# Patient Record
Sex: Female | Born: 1965 | Race: White | Hispanic: No | Marital: Single | Smoking: Never smoker
Health system: Southern US, Community
[De-identification: ages and names within clinical notes are randomized; demographics above are authoritative.]

## PROBLEM LIST (undated history)

## (undated) DIAGNOSIS — N6019 Diffuse cystic mastopathy of unspecified breast: Secondary | ICD-10-CM

## (undated) DIAGNOSIS — N63 Unspecified lump in unspecified breast: Secondary | ICD-10-CM

## (undated) DIAGNOSIS — E039 Hypothyroidism, unspecified: Secondary | ICD-10-CM

## (undated) DIAGNOSIS — T7840XA Allergy, unspecified, initial encounter: Secondary | ICD-10-CM

## (undated) DIAGNOSIS — C439 Malignant melanoma of skin, unspecified: Secondary | ICD-10-CM

## (undated) DIAGNOSIS — R319 Hematuria, unspecified: Secondary | ICD-10-CM

## (undated) HISTORY — DX: Hematuria, unspecified: R31.9

## (undated) HISTORY — DX: Unspecified lump in unspecified breast: N63.0

## (undated) HISTORY — DX: Malignant melanoma of skin, unspecified: C43.9

## (undated) HISTORY — DX: Hypothyroidism, unspecified: E03.9

## (undated) HISTORY — PX: TONSILLECTOMY: SHX5217

## (undated) HISTORY — DX: Allergy, unspecified, initial encounter: T78.40XA

## (undated) HISTORY — DX: Diffuse cystic mastopathy of unspecified breast: N60.19

---

## 1994-07-10 HISTORY — PX: CERVICAL CONE BIOPSY: SUR198

## 1997-12-10 ENCOUNTER — Encounter: Admission: RE | Admit: 1997-12-10 | Discharge: 1998-03-10 | Payer: Self-pay | Admitting: Internal Medicine

## 1999-06-22 ENCOUNTER — Other Ambulatory Visit: Admission: RE | Admit: 1999-06-22 | Discharge: 1999-06-22 | Payer: Self-pay | Admitting: Obstetrics and Gynecology

## 1999-12-13 ENCOUNTER — Other Ambulatory Visit: Admission: RE | Admit: 1999-12-13 | Discharge: 1999-12-13 | Payer: Self-pay | Admitting: Obstetrics and Gynecology

## 2000-12-18 ENCOUNTER — Other Ambulatory Visit: Admission: RE | Admit: 2000-12-18 | Discharge: 2000-12-18 | Payer: Self-pay | Admitting: Obstetrics and Gynecology

## 2001-03-25 ENCOUNTER — Other Ambulatory Visit: Admission: RE | Admit: 2001-03-25 | Discharge: 2001-03-25 | Payer: Self-pay | Admitting: Internal Medicine

## 2001-07-04 ENCOUNTER — Other Ambulatory Visit: Admission: RE | Admit: 2001-07-04 | Discharge: 2001-07-04 | Payer: Self-pay | Admitting: Obstetrics and Gynecology

## 2001-12-23 ENCOUNTER — Other Ambulatory Visit: Admission: RE | Admit: 2001-12-23 | Discharge: 2001-12-23 | Payer: Self-pay | Admitting: Obstetrics and Gynecology

## 2004-03-30 ENCOUNTER — Other Ambulatory Visit: Admission: RE | Admit: 2004-03-30 | Discharge: 2004-03-30 | Payer: Self-pay | Admitting: Obstetrics and Gynecology

## 2005-01-12 ENCOUNTER — Other Ambulatory Visit: Admission: RE | Admit: 2005-01-12 | Discharge: 2005-01-12 | Payer: Self-pay | Admitting: Obstetrics and Gynecology

## 2006-01-25 ENCOUNTER — Other Ambulatory Visit: Admission: RE | Admit: 2006-01-25 | Discharge: 2006-01-25 | Payer: Self-pay | Admitting: Obstetrics and Gynecology

## 2010-05-25 ENCOUNTER — Encounter: Admission: RE | Admit: 2010-05-25 | Discharge: 2010-05-25 | Payer: Self-pay | Admitting: Obstetrics and Gynecology

## 2011-06-14 ENCOUNTER — Other Ambulatory Visit: Payer: Self-pay | Admitting: Obstetrics and Gynecology

## 2011-06-14 DIAGNOSIS — Z1231 Encounter for screening mammogram for malignant neoplasm of breast: Secondary | ICD-10-CM

## 2011-06-29 ENCOUNTER — Ambulatory Visit: Payer: Self-pay

## 2011-06-29 ENCOUNTER — Ambulatory Visit
Admission: RE | Admit: 2011-06-29 | Discharge: 2011-06-29 | Disposition: A | Discharge status: Home / Self Care | Payer: PRIVATE HEALTH INSURANCE | Source: Ambulatory Visit | Attending: Obstetrics and Gynecology | Admitting: Obstetrics and Gynecology

## 2011-06-29 DIAGNOSIS — Z1231 Encounter for screening mammogram for malignant neoplasm of breast: Secondary | ICD-10-CM

## 2011-07-29 IMAGING — MG MM DIGITAL SCREENING
4 series · 4 of 4 positions shown · non-contrast
Comparison: Prior studies.

DG SCREEN MAMMOGRAM BILATERAL
Bilateral CC and MLO view(s) were taken.

DIGITAL SCREENING MAMMOGRAM WITH CAD:

[R CC]
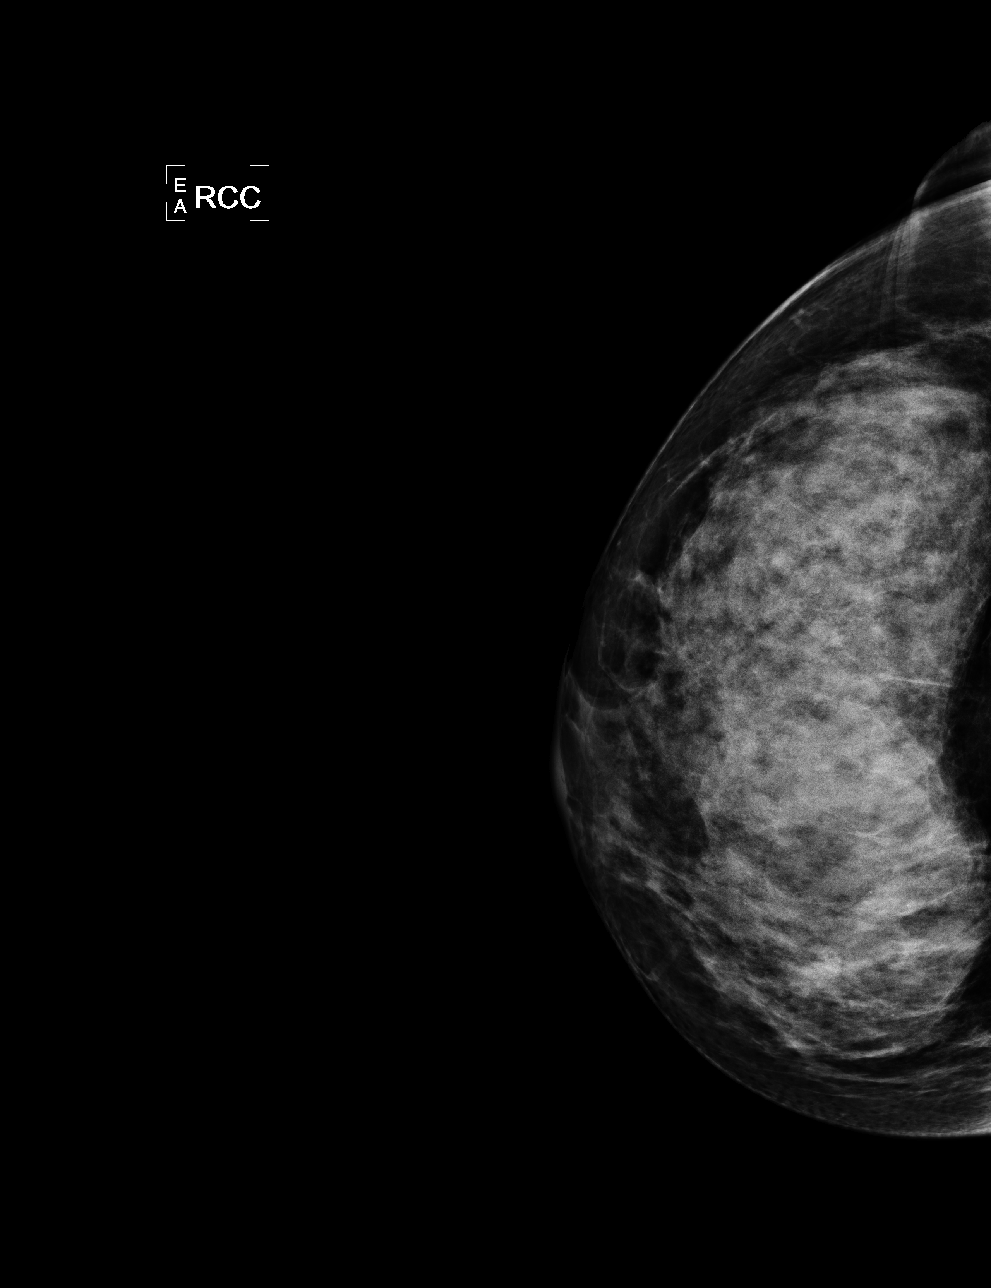

[L CC]
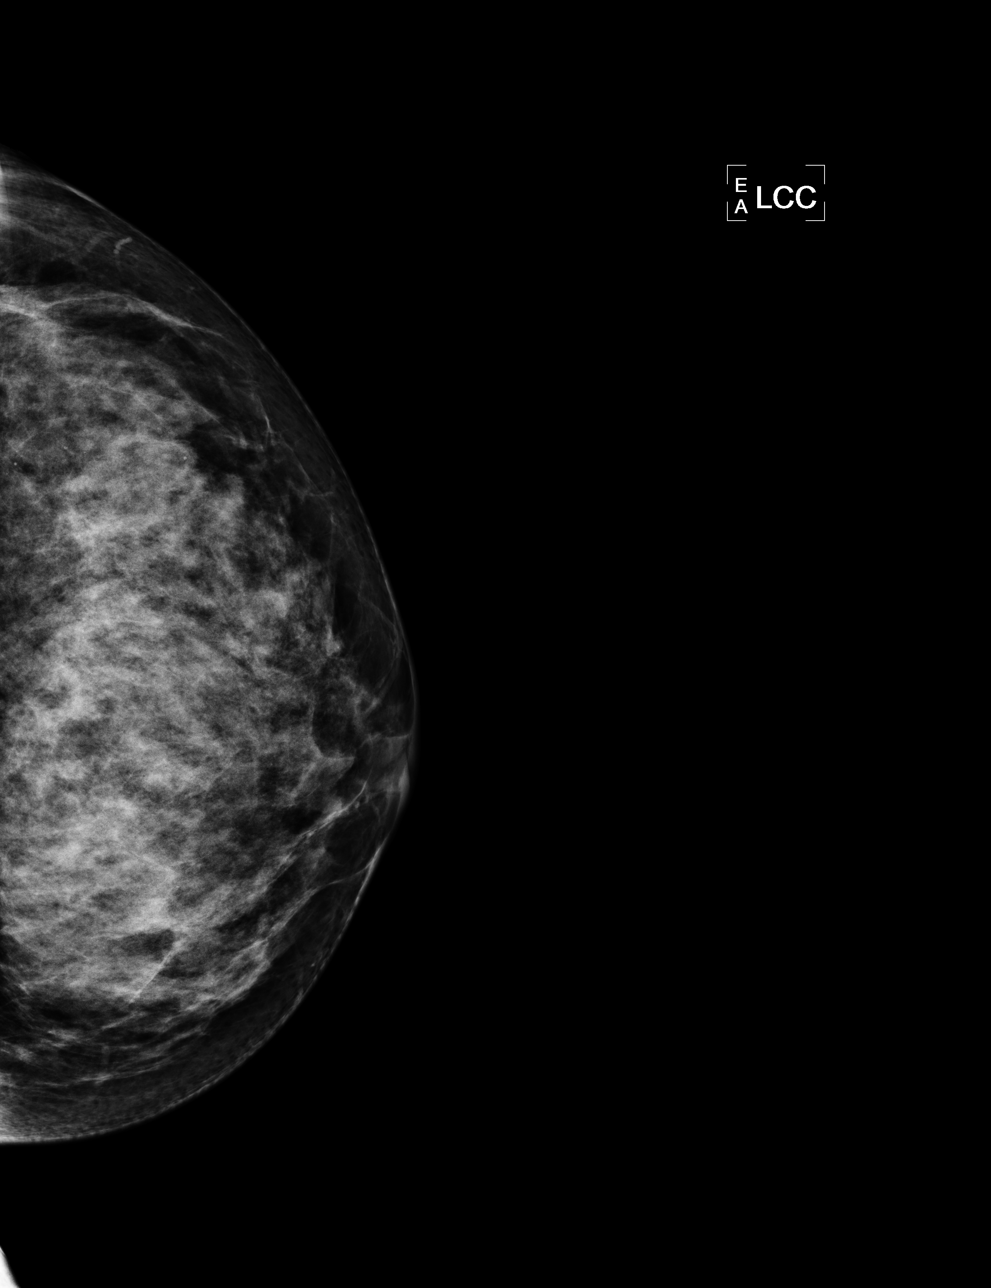

[L MLO]
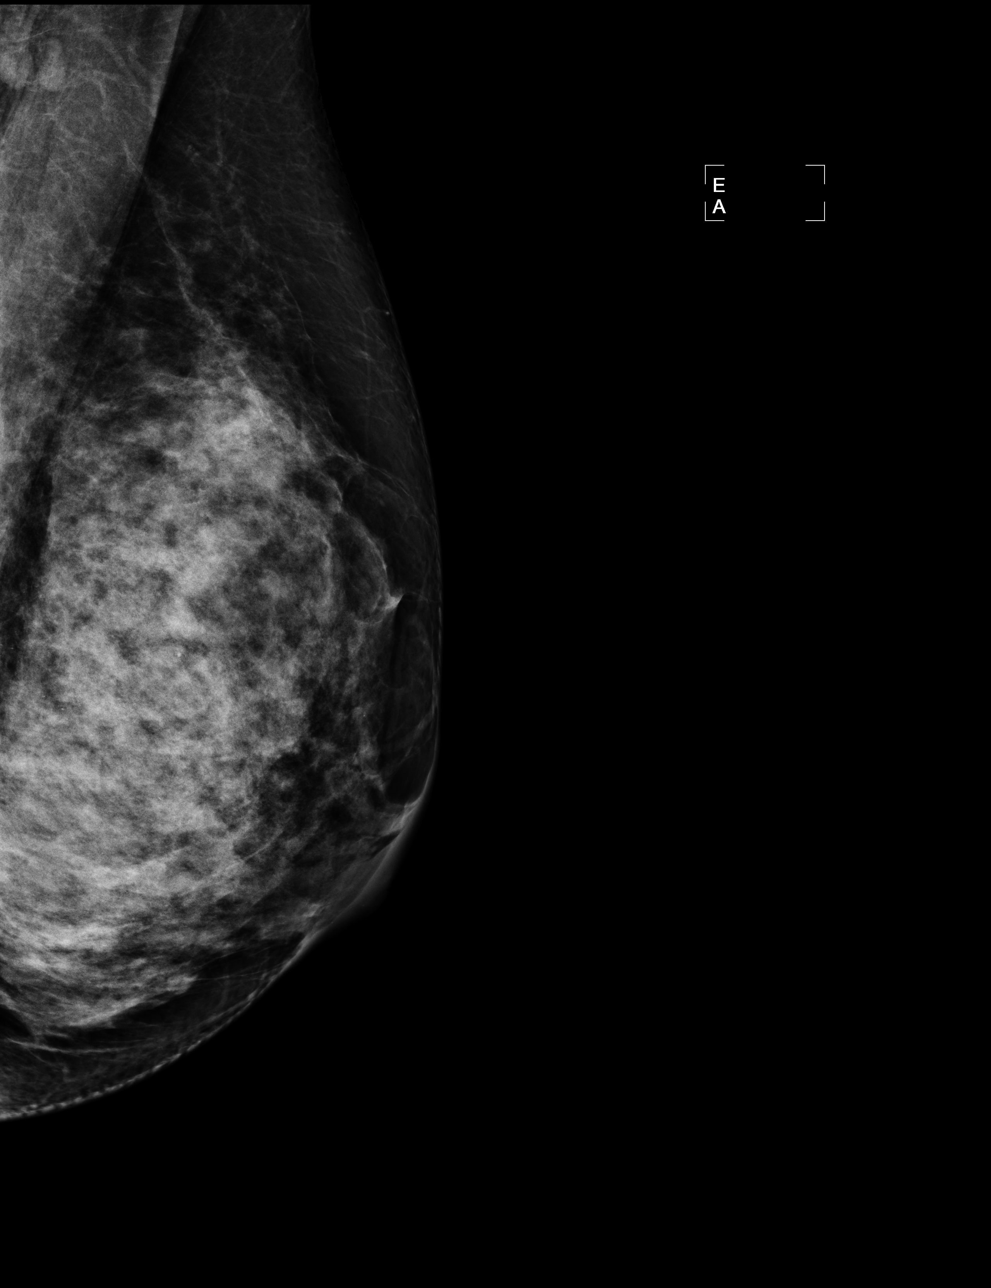

[R MLO]
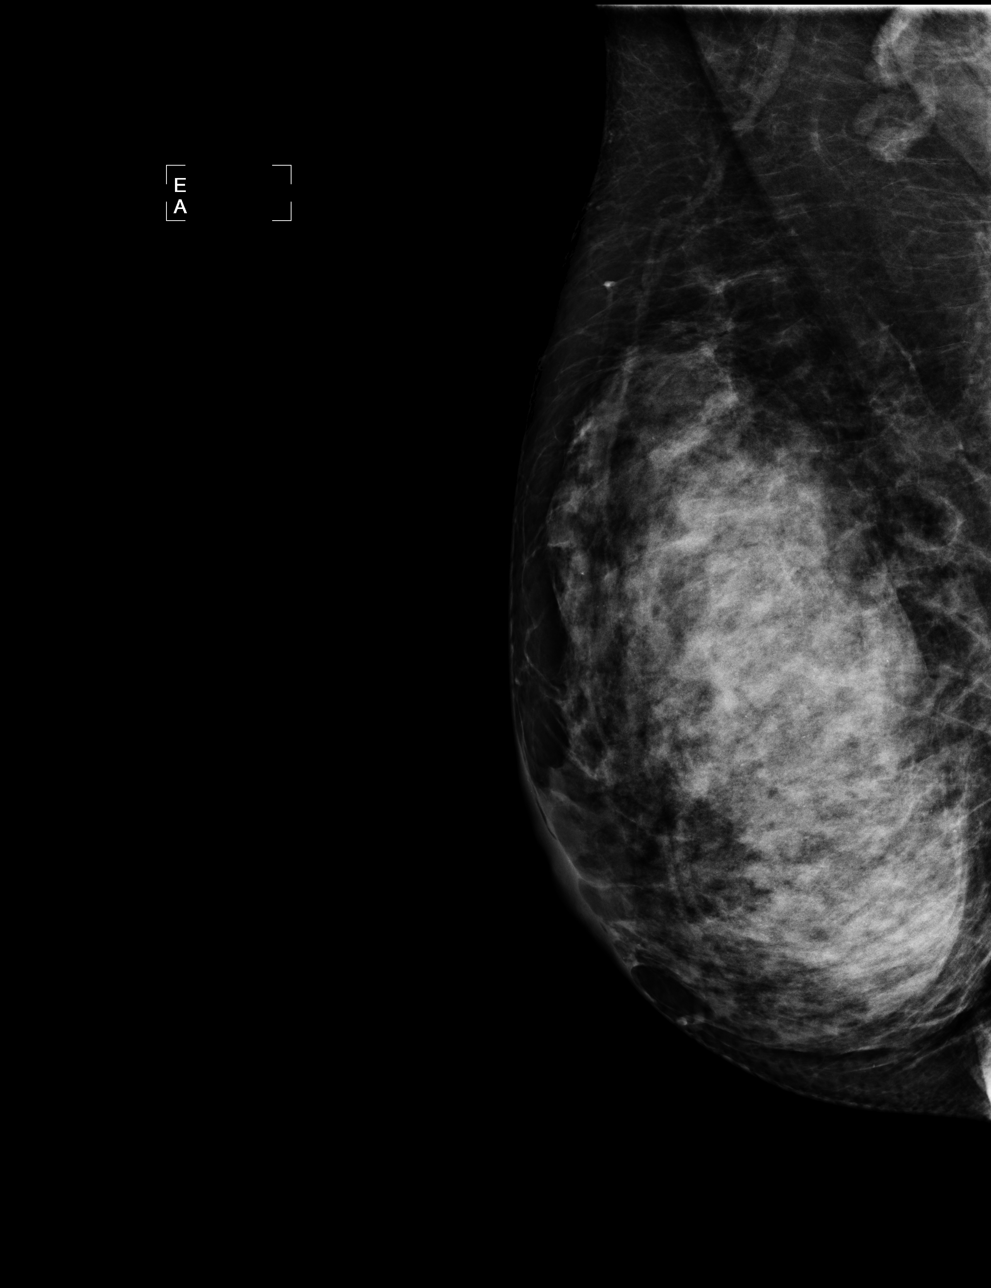

[4 of 4 positions shown; findings below may reference images not displayed]

The breast tissue is extremely dense.  There is no dominant mass, architectural distortion or 
calcification to suggest malignancy.

Images were processed with CAD.
IMPRESSION: No mammographic evidence of malignancy.  Suggest yearly screening mammography.

A result letter of this screening mammogram will be mailed directly to the patient.

ASSESSMENT: Negative - BI-RADS 1

Screening mammogram in 1 year.
,

## 2011-08-30 ENCOUNTER — Encounter: Payer: Self-pay | Admitting: Family Medicine

## 2011-08-30 ENCOUNTER — Ambulatory Visit (INDEPENDENT_AMBULATORY_CARE_PROVIDER_SITE_OTHER): Payer: PRIVATE HEALTH INSURANCE | Admitting: Family Medicine

## 2011-08-30 VITALS — BP 122/76 | HR 80 | Ht 65.0 in | Wt 126.0 lb

## 2011-08-30 DIAGNOSIS — M25519 Pain in unspecified shoulder: Secondary | ICD-10-CM

## 2011-08-30 MED ORDER — NAPROXEN SODIUM ER 500 MG PO TB24: ORAL_TABLET | ORAL | Status: DC
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
## 2011-08-30 NOTE — Patient Instructions (Signed)
Will start with anti-inflammatory course.  If symptoms persist or worsen, will then check x-ray.  If x-ray is normal, but pain persists, then consider labs (ESR, CBC) and referral to orthopedist.  Trial of cool compresses/ice (since heat exacerbated the burning sensation) an rest.

## 2011-09-11 ENCOUNTER — Ambulatory Visit: Payer: PRIVATE HEALTH INSURANCE | Admitting: Family Medicine

## 2011-09-15 ENCOUNTER — Telehealth: Payer: Self-pay | Admitting: Internal Medicine

## 2011-09-15 DIAGNOSIS — M25519 Pain in unspecified shoulder: Secondary | ICD-10-CM

## 2011-09-15 NOTE — Telephone Encounter (Signed)
Advise pt x-ray in system for her to go to [facility].  Okay for her to do labs elsewhere if more convenient for her.  I'd start with CBC and ESR (sed rate).  Will do referral to ortho when results are back.  The other option is just to send her directly to ortho, and they can do x-rays at their office.  If she chooses that, please refer to [place] [facility] (or elsewhere if she has a preference) and cancel x-ray order.

## 2011-09-15 NOTE — Telephone Encounter (Signed)
Pt informed and getting xray

## 2011-09-21 ENCOUNTER — Ambulatory Visit
Admission: RE | Admit: 2011-09-21 | Discharge: 2011-09-21 | Disposition: A | Discharge status: Home / Self Care | Payer: PRIVATE HEALTH INSURANCE | Source: Ambulatory Visit | Attending: Family Medicine | Admitting: Family Medicine

## 2011-09-21 DIAGNOSIS — M25519 Pain in unspecified shoulder: Secondary | ICD-10-CM

## 2011-09-27 ENCOUNTER — Telehealth: Payer: Self-pay | Admitting: *Deleted

## 2011-09-27 NOTE — Telephone Encounter (Signed)
Tried to call patient to give her consult info on Monday, not sure if she called back and spoke to someone so I called her again and left a message informing her that she is scheduled with Dr.Chandler @ [facility] 09/29/11 @ 9:45am.

## 2011-10-01 ENCOUNTER — Other Ambulatory Visit: Payer: Self-pay | Admitting: Family Medicine

## 2011-10-02 ENCOUNTER — Telehealth: Payer: Self-pay | Admitting: *Deleted

## 2011-10-02 NOTE — Telephone Encounter (Signed)
Called patient and left message to see if patient saw ortho as scheduled on 09/29/11. Trying to see if she still needs this rx to be filled.

## 2011-10-02 NOTE — Telephone Encounter (Signed)
Left message for patient to call me back to find out whether she saw orthopedics on 09/29/11. Not sure that she should need this medication if she was seen by them.

## 2011-10-03 NOTE — Telephone Encounter (Signed)
Pt called and she did go to the ortho last week and had MRI and she does want the Naproxyn refilled.

## 2011-10-03 NOTE — Telephone Encounter (Signed)
Is this ok?

## 2011-10-03 NOTE — Telephone Encounter (Signed)
E-rxd

## 2011-11-08 ENCOUNTER — Encounter: Payer: Self-pay | Admitting: Nurse Practitioner

## 2012-01-01 ENCOUNTER — Other Ambulatory Visit: Payer: Self-pay | Admitting: Obstetrics and Gynecology

## 2012-01-01 DIAGNOSIS — N6459 Other signs and symptoms in breast: Secondary | ICD-10-CM

## 2012-01-08 ENCOUNTER — Ambulatory Visit
Admission: RE | Admit: 2012-01-08 | Discharge: 2012-01-08 | Disposition: A | Discharge status: Home / Self Care | Payer: Self-pay | Source: Ambulatory Visit | Attending: Obstetrics and Gynecology | Admitting: Obstetrics and Gynecology

## 2012-01-08 DIAGNOSIS — N6459 Other signs and symptoms in breast: Secondary | ICD-10-CM

## 2012-07-04 ENCOUNTER — Other Ambulatory Visit: Payer: Self-pay | Admitting: Obstetrics and Gynecology

## 2012-07-04 DIAGNOSIS — Z1231 Encounter for screening mammogram for malignant neoplasm of breast: Secondary | ICD-10-CM

## 2012-07-19 ENCOUNTER — Ambulatory Visit
Admission: RE | Admit: 2012-07-19 | Discharge: 2012-07-19 | Disposition: A | Discharge status: Home / Self Care | Payer: Self-pay | Source: Ambulatory Visit | Attending: Obstetrics and Gynecology | Admitting: Obstetrics and Gynecology

## 2012-07-19 DIAGNOSIS — Z1231 Encounter for screening mammogram for malignant neoplasm of breast: Secondary | ICD-10-CM

## 2012-07-25 ENCOUNTER — Ambulatory Visit: Payer: Self-pay

## 2012-07-31 ENCOUNTER — Other Ambulatory Visit: Payer: Self-pay | Admitting: Obstetrics and Gynecology

## 2012-07-31 DIAGNOSIS — R928 Other abnormal and inconclusive findings on diagnostic imaging of breast: Secondary | ICD-10-CM

## 2012-08-06 ENCOUNTER — Ambulatory Visit
Admission: RE | Admit: 2012-08-06 | Discharge: 2012-08-06 | Disposition: A | Discharge status: Home / Self Care | Payer: BC Managed Care – PPO | Source: Ambulatory Visit | Attending: Obstetrics and Gynecology | Admitting: Obstetrics and Gynecology

## 2012-08-06 DIAGNOSIS — R928 Other abnormal and inconclusive findings on diagnostic imaging of breast: Secondary | ICD-10-CM

## 2012-08-19 ENCOUNTER — Other Ambulatory Visit: Payer: Self-pay | Admitting: Family Medicine

## 2012-11-23 IMAGING — CR DG CLAVICLE*R*
2 series · 2 of 2 positions shown · non-contrast
Comparison: None.

CLINICAL DATA: Sternoclavicular swelling

RIGHT CLAVICLE - 2+ VIEWS

[view not recorded (1 of 2)]
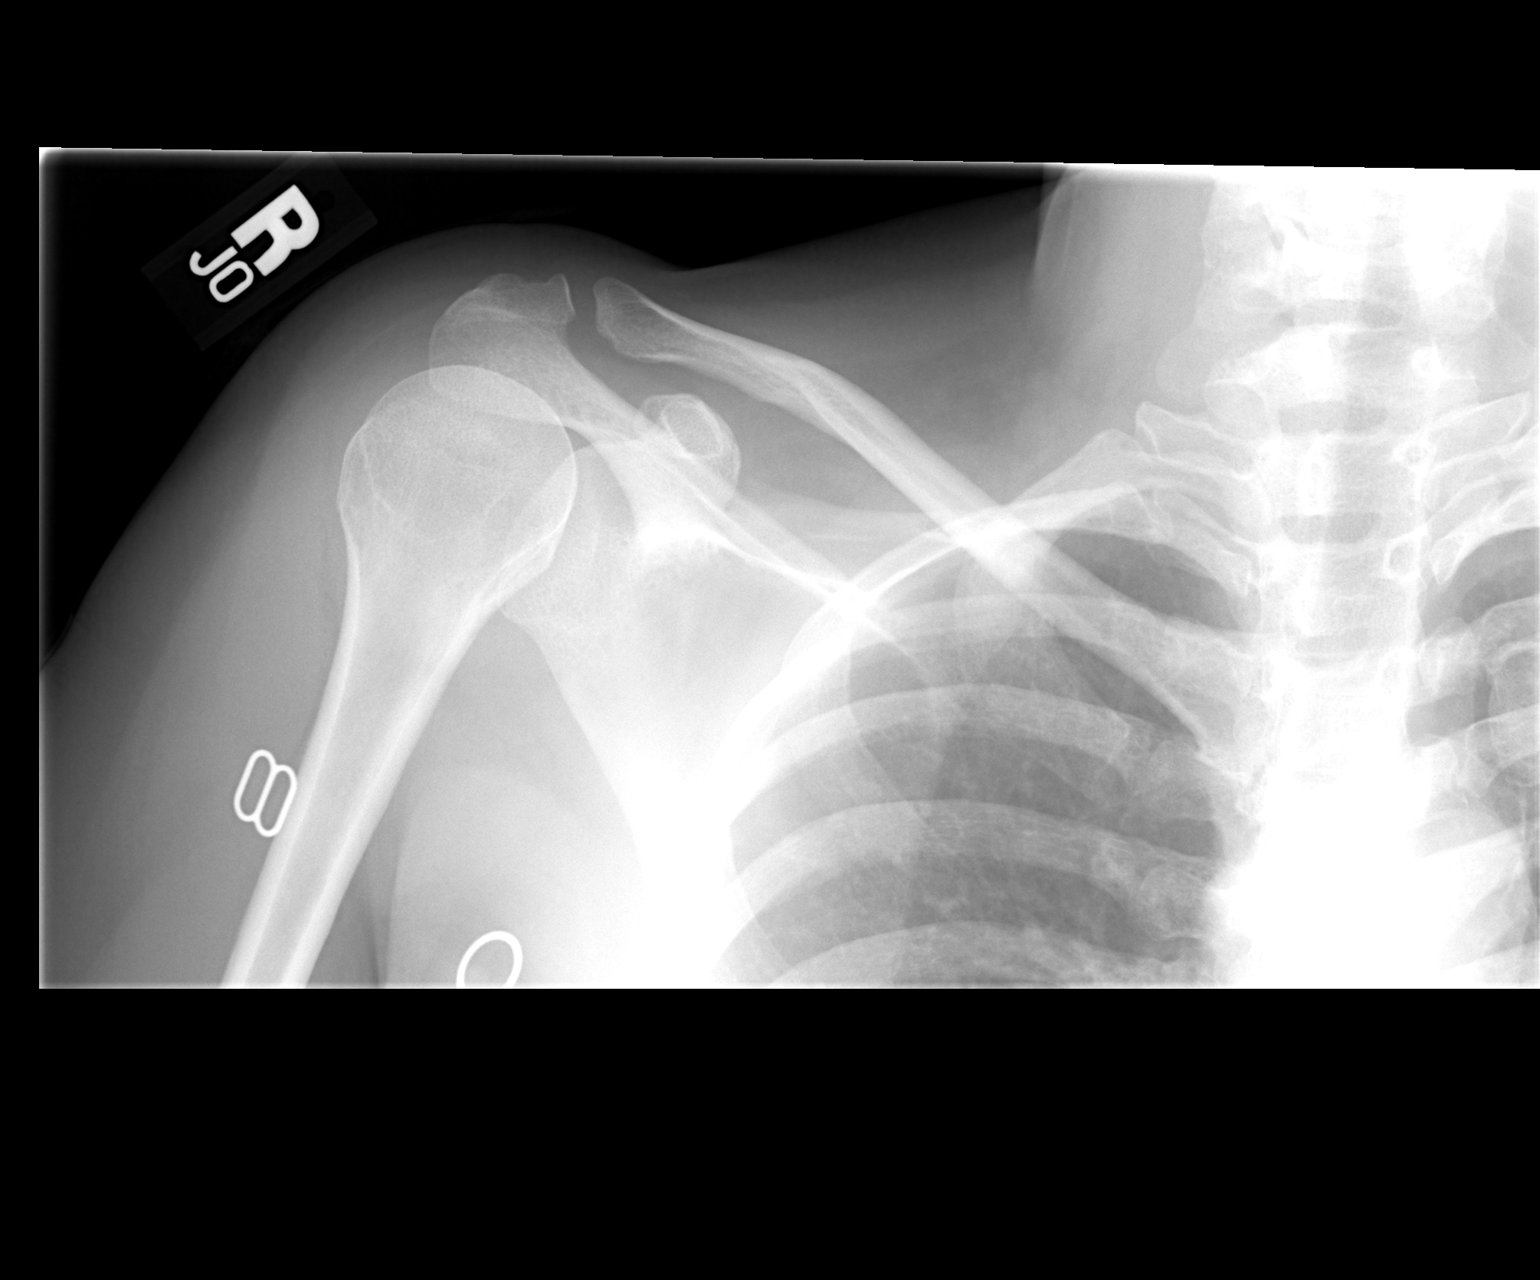

[view not recorded (2 of 2)]
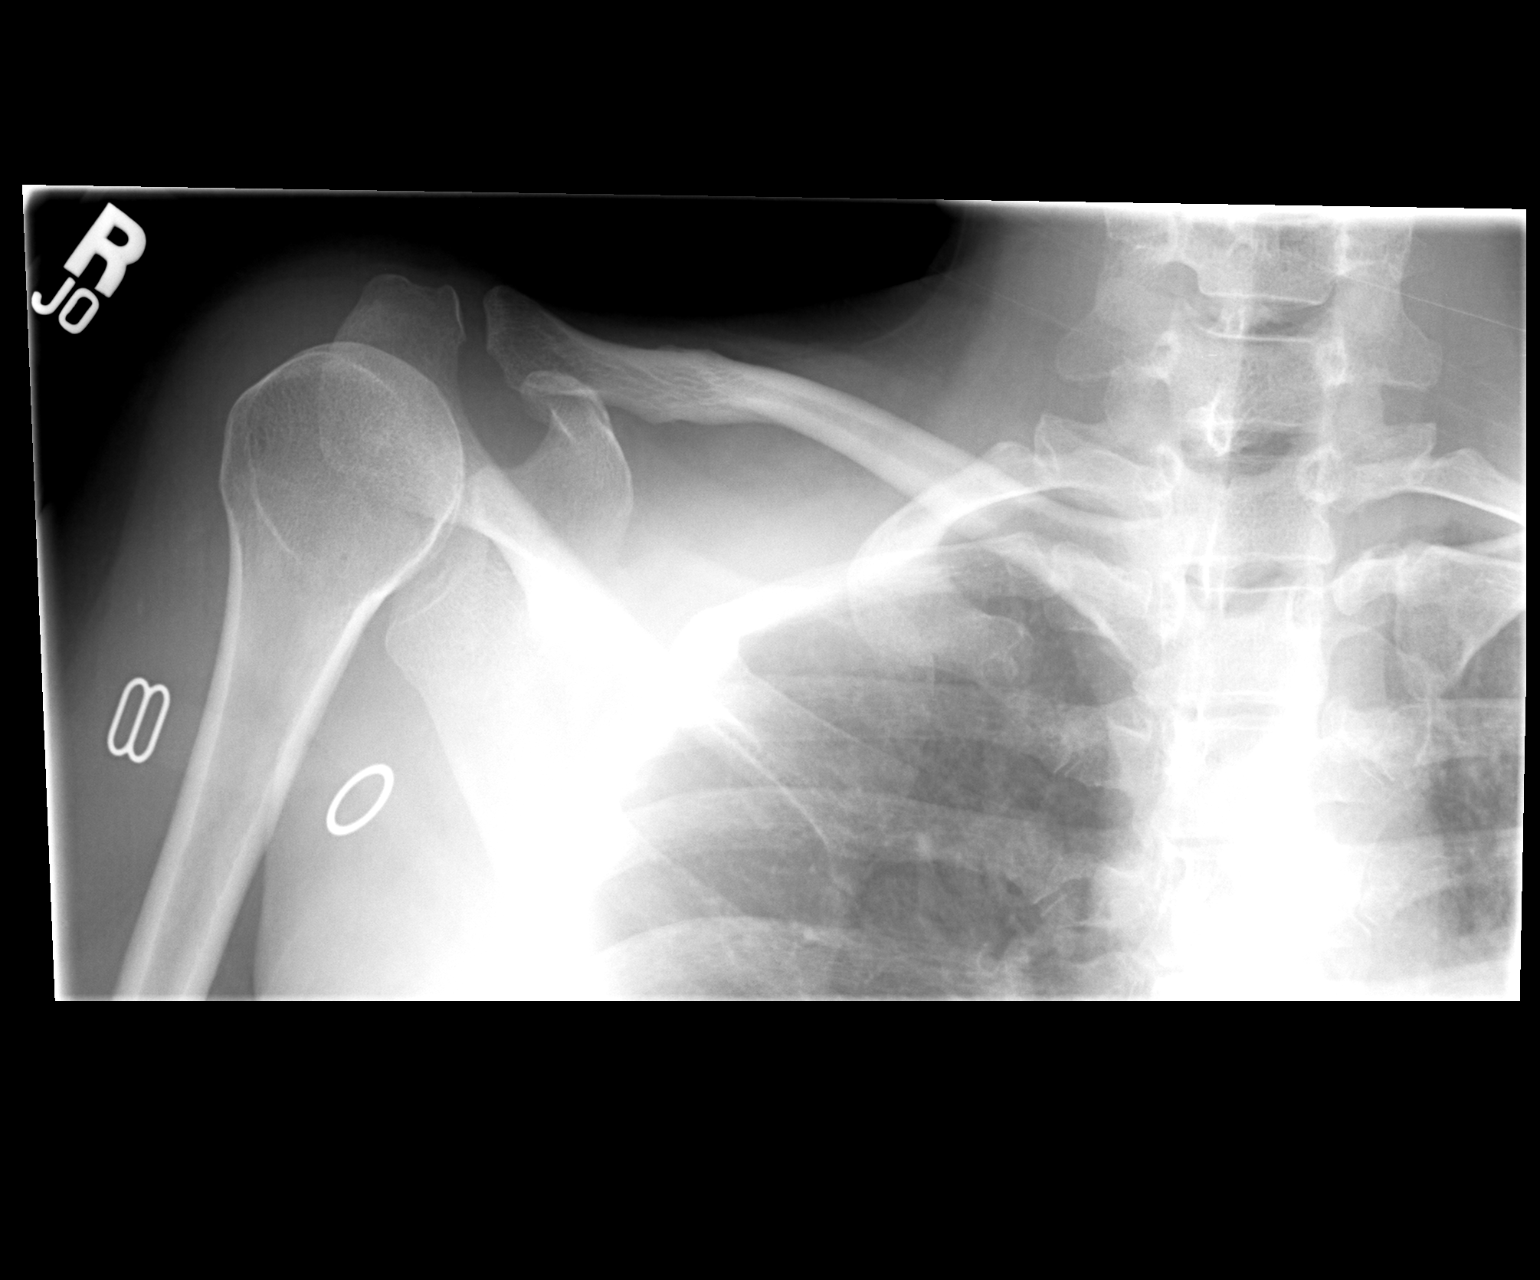

[2 of 2 positions shown; findings below may reference images not displayed]

FINDINGS: Negative for fracture, dislocation, or other acute
abnormality.  Normal alignment and mineralization. No significant
degenerative change.  Regional soft tissues unremarkable.
IMPRESSION: Negative

## 2013-01-07 DIAGNOSIS — C439 Malignant melanoma of skin, unspecified: Secondary | ICD-10-CM

## 2013-01-07 HISTORY — DX: Malignant melanoma of skin, unspecified: C43.9

## 2013-02-25 ENCOUNTER — Ambulatory Visit: Payer: BC Managed Care – PPO

## 2013-03-31 ENCOUNTER — Ambulatory Visit
Admission: RE | Admit: 2013-03-31 | Discharge: 2013-03-31 | Disposition: A | Discharge status: Home / Self Care | Payer: BC Managed Care – PPO | Source: Ambulatory Visit | Attending: Obstetrics and Gynecology | Admitting: Obstetrics and Gynecology

## 2013-03-31 ENCOUNTER — Other Ambulatory Visit: Payer: Self-pay | Admitting: Obstetrics and Gynecology

## 2013-03-31 DIAGNOSIS — R921 Mammographic calcification found on diagnostic imaging of breast: Secondary | ICD-10-CM

## 2013-09-17 ENCOUNTER — Other Ambulatory Visit: Payer: Self-pay

## 2013-09-17 DIAGNOSIS — Z1231 Encounter for screening mammogram for malignant neoplasm of breast: Secondary | ICD-10-CM

## 2013-10-03 ENCOUNTER — Ambulatory Visit
Admission: RE | Admit: 2013-10-03 | Discharge: 2013-10-03 | Disposition: A | Discharge status: Home / Self Care | Payer: BC Managed Care – PPO | Source: Ambulatory Visit

## 2013-10-03 DIAGNOSIS — Z1231 Encounter for screening mammogram for malignant neoplasm of breast: Secondary | ICD-10-CM

## 2014-01-29 ENCOUNTER — Other Ambulatory Visit: Payer: Self-pay | Admitting: Obstetrics and Gynecology

## 2014-01-29 DIAGNOSIS — N63 Unspecified lump in unspecified breast: Secondary | ICD-10-CM

## 2014-02-02 ENCOUNTER — Encounter (INDEPENDENT_AMBULATORY_CARE_PROVIDER_SITE_OTHER): Payer: Self-pay

## 2014-02-02 ENCOUNTER — Ambulatory Visit
Admission: RE | Admit: 2014-02-02 | Discharge: 2014-02-02 | Disposition: A | Discharge status: Home / Self Care | Payer: BC Managed Care – PPO | Source: Ambulatory Visit | Attending: Obstetrics and Gynecology | Admitting: Obstetrics and Gynecology

## 2014-02-02 DIAGNOSIS — N63 Unspecified lump in unspecified breast: Secondary | ICD-10-CM

## 2014-12-14 ENCOUNTER — Encounter: Payer: Self-pay | Admitting: Internal Medicine

## 2015-05-12 ENCOUNTER — Encounter: Payer: Self-pay | Admitting: *Deleted

## 2015-09-19 ENCOUNTER — Encounter: Payer: Self-pay | Admitting: Family Medicine

## 2016-05-31 LAB — HM PAP SMEAR

## 2017-04-28 ENCOUNTER — Encounter: Payer: Self-pay | Admitting: Emergency Medicine

## 2017-04-28 ENCOUNTER — Ambulatory Visit (INDEPENDENT_AMBULATORY_CARE_PROVIDER_SITE_OTHER): Payer: Managed Care, Other (non HMO) | Admitting: Emergency Medicine

## 2017-04-28 VITALS — BP 110/60 | HR 90 | Temp 98.2°F | Resp 16 | Ht 65.0 in | Wt 126.8 lb

## 2017-04-28 DIAGNOSIS — A6004 Herpesviral vulvovaginitis: Secondary | ICD-10-CM | POA: Insufficient documentation

## 2017-04-28 MED ORDER — ACYCLOVIR 200 MG PO CAPS: 400.0000 mg | ORAL_CAPSULE | Freq: Two times a day (BID) | ORAL | 3 refills | Status: AC | PRN

## 2017-04-28 NOTE — Progress Notes (Signed)
Cheyenne Stanley 51 y.o.   Chief Complaint  Patient presents with  . Medication Refill    ACYCLOVIR    HISTORY OF PRESENT ILLNESS: This is a 51 y.o. female complaining of herpes genital lesions; has history of same but not very frequent.  HPI   Prior to Admission medications   Medication Sig Start Date End Date Taking? Authorizing Provider  acyclovir (ZOVIRAX) 200 MG capsule Take 400 mg by mouth 2 (two) times daily as needed.    Yes [provider]  etonogestrel-ethinyl estradiol (NUVARING) 0.12-0.015 MG/24HR vaginal ring Place 1 each vaginally every 28 (twenty-eight) days. Insert vaginally and leave in place for 3 consecutive weeks, then remove for 1 week.    [provider]  naproxen (NAPRELAN) 500 MG 24 hr tablet Take 2 tablets with food once daily. Take for 7-14 days, until pain resolved Patient not taking: Reported on 04/28/2017 08/30/11   [name], MD  naproxen (NAPRELAN) 500 MG 24 hr tablet Take 1-2 tablets by mouth with food daily until pain resolves Patient not taking: Reported on 04/28/2017 08/30/11   [name], MD  naproxen (NAPROSYN) 500 MG tablet TAKE TWO (2) TABLETS WITH FOOD ONCE DAILY.  TAKE FOR 7 TO 14 DAYS, UNTIL PAIN RESOLVED. Patient not taking: Reported on 04/28/2017 10/01/11   [name], MD    No Known Allergies  There are no active problems to display for this patient.   Past Medical History:  Diagnosis Date  . Allergy   . Melanoma ([place]) 01/2013   (per GYN's records 09/2015)    Past Surgical History:  Procedure Laterality Date  . 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 Ulanda Edison)  . TONSILLECTOMY  child    Social History   Social History  . Marital status: Single    Spouse name: N/A  . Number of children: 0  . Years of education: N/A   Occupational History  . 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      Social History Main Topics  . Smoking status: Never Smoker  .  Smokeless tobacco: Never Used  . Alcohol use No     Comment: 1 glass on wine on weekend.  . Drug use: No  . Sexual activity: Yes    Partners: Male    Birth control/ protection: Inserts     Comment: Nuva Ring   Other Topics Concern  . Not on file   Social History Narrative   Lives with fiance, 2 dogs. Exercise: Walk days a week for 30-40 minutes, total gym 8mday.    Family History  Problem Relation Age of Onset  . Asthma Mother   . Hypothyroidism Mother   . Cancer Mother        breast cancer (at 636, multiple myeloma (68  . Stroke Father   . Alcohol abuse Father   . Cancer Maternal Aunt        kidney cancer  . Hypothyroidism Maternal Uncle   . Diabetes Neg Hx   . Heart disease Neg Hx      Review of Systems  Constitutional: Negative.  Negative for chills and fever.  HENT: Negative for sore throat.   Eyes: Negative for discharge and redness.  Respiratory: Negative for shortness of breath.   Cardiovascular: Negative for chest pain and leg swelling.  Gastrointestinal: Negative for nausea and vomiting.  Genitourinary: Negative for dysuria and hematuria.  Skin: Positive for rash.  All  other systems reviewed and are negative.    Vitals:   04/28/17 1445  BP: 110/60  Pulse: 90  Resp: 16  Temp: 98.2 F (36.8 C)  SpO2: 98%     Physical Exam  Constitutional: She is oriented to person, place, and time. She appears well-developed and well-nourished.  HENT:  Head: Normocephalic.  Eyes: Pupils are equal, round, and reactive to light.  Neck: Normal range of motion.  Cardiovascular: Normal rate.   Pulmonary/Chest: Effort normal.  Musculoskeletal: Normal range of motion.  Neurological: She is alert and oriented to person, place, and time.  Skin: Skin is warm and dry.  Psychiatric: She has a normal mood and affect. Her behavior is normal.  Vitals reviewed.    ASSESSMENT & PLAN: Erendira was seen today for medication refill.  Diagnoses and all orders for this  visit:  Herpes simplex vulvovaginitis -     acyclovir (ZOVIRAX) 200 MG capsule; Take 2 capsules (400 mg total) by mouth 2 (two) times daily as needed.    Patient Instructions       IF you received an x-ray today, you will receive an invoice from [facility]. Please contact [facility] at [phone] with questions or concerns regarding your invoice.   IF you received labwork today, you will receive an invoice from [facility]. Please contact [facility] at [phone] with questions or concerns regarding your invoice.   Our billing staff will not be able to assist you with questions regarding bills from these companies.  You will be contacted with the lab results as soon as they are available. The fastest way to get your results is to activate your My Chart account. Instructions are located on the last page of this paperwork. If you have not heard from [place] regarding the results in 2 weeks, please contact this office.     Genital Herpes Genital herpes is a common sexually transmitted infection (STI) that is caused by a virus. The virus spreads from person to person through sexual contact. Infection can cause itching, blisters, and sores around the genitals or rectum. Symptoms may last several days and then go away This is called an outbreak. However, the virus remains in your body, so you may have more outbreaks in the future. The time between outbreaks varies and can be months or years. Genital herpes affects men and women. It is particularly concerning for pregnant women because the virus can be passed to the baby during delivery and can cause serious problems. Genital herpes is also a concern for people who have a weak disease-fighting (immune) system. What are the causes? This condition is caused by the herpes simplex virus (HSV) type 1 or type 2. The virus may spread through:  Sexual contact with an infected person, including vaginal, anal, and oral sex.  Contact with  fluid from a herpes sore.  The skin. This means that you can get herpes from an infected partner even if he or she does not have a visible sore or does not know that he or she is infected.  What increases the risk? You are more likely to develop this condition if:  You have sex with many partners.  You do not use latex condoms during sex.  What are the signs or symptoms? Most people do not have symptoms (asymptomatic) or have mild symptoms that may be mistaken for other skin problems. Symptoms may include:  Small red bumps near the genitals, rectum, or mouth. These bumps turn into blisters and then turn into  sores.  Flu-like symptoms, including: ? Fever. ? Body aches. ? Swollen lymph nodes. ? Headache.  Painful urination.  Pain and itching in the genital area or rectal area.  Vaginal discharge.  Tingling or shooting pain in the legs and buttocks.  Generally, symptoms are more severe and last longer during the first (primary) outbreak. Flu-like symptoms are also more common during the primary outbreak. How is this diagnosed? Genital herpes may be diagnosed based on:  A physical exam.  Your medical history.  Blood tests.  A test of a fluid sample (culture) from an open sore.  How is this treated? There is no cure for this condition, but treatment with antiviral medicines that are taken by mouth (orally) can do the following:  Speed up healing and relieve symptoms.  Help to reduce the spread of the virus to sexual partners.  Limit the chance of future outbreaks, or make future outbreaks shorter.  Lessen symptoms of future outbreaks.  Your health care provider may also recommend pain relief medicines, such as aspirin or ibuprofen. Follow these instructions at home: Sexual activity  Do not have sexual contact during active outbreaks.  Practice safe sex. Latex condoms and female condoms may help prevent the spread of the herpes virus. General instructions  Keep  the affected areas dry and clean.  Take over-the-counter and prescription medicines only as told by your health care provider.  Avoid rubbing or touching blisters and sores. If you do touch blisters or sores: ? Wash your hands thoroughly with soap and water. ? Do not touch your eyes afterward.  To help relieve pain or itching, you may take the following actions as directed by your health care provider: ? Apply a cold, wet cloth (cold compress) to affected areas 4-6 times a day. ? Apply a substance that protects your skin and reduces bleeding (astringent). ? Apply a gel that helps relieve pain around sores (lidocaine gel). ? Take a warm, shallow bath that cleans the genital area (sitz bath).  Keep all follow-up visits as told by your health care provider. This is important. How is this prevented?  Use condoms. Although anyone can get genital herpes during sexual contact, even with the use of a condom, a condom can provide some protection.  Avoid having multiple sexual partners.  Talk with your sexual partner about any symptoms either of you may have. Also, talk with your partner about any history of STIs.  Get tested for STIs before you have sex. Ask your partner to do the same.  Do not have sexual contact if you have symptoms of genital herpes. Contact a health care provider if:  Your symptoms are not improving with medicine.  Your symptoms return.  You have new symptoms.  You have a fever.  You have abdominal pain.  You have redness, swelling, or pain in your eye.  You notice new sores on other parts of your body.  You are a woman and experience bleeding between menstrual periods.  You have had herpes and you become pregnant or plan to become pregnant. Summary  Genital herpes is a common sexually transmitted infection (STI) that is caused by the herpes simplex virus (HSV) type 1 or type 2.  These viruses are most often spread through sexual contact with an infected  person.  You are more likely to develop this condition if you have sex with many partners or you have unprotected sex.  Most people do not have symptoms (asymptomatic) or have mild symptoms that may  be mistaken for other skin problems. Symptoms occur as outbreaks that may happen months or years apart.  There is no cure for this condition, but treatment with oral antiviral medicines can reduce symptoms, reduce the chance of spreading the virus to a partner, prevent future outbreaks, or shorten future outbreaks. This information is not intended to replace advice given to you by your health care provider. Make sure you discuss any questions you have with your health care provider. Document Released: 06/23/2000 Document Revised: 05/26/2016 Document Reviewed: 05/26/2016 Elsevier Interactive Patient Education  2017 [employer].      Agustina Caroli, MD [facility]

## 2017-04-28 NOTE — Patient Instructions (Addendum)
IF you received an x-ray today, you will receive an invoice from [facility]. Please contact [facility] at [phone] with questions or concerns regarding your invoice.   IF you received labwork today, you will receive an invoice from [facility]. Please contact [facility] at [phone] with questions or concerns regarding your invoice.   Our billing staff will not be able to assist you with questions regarding bills from these companies.  You will be contacted with the lab results as soon as they are available. The fastest way to get your results is to activate your My Chart account. Instructions are located on the last page of this paperwork. If you have not heard from [place] regarding the results in 2 weeks, please contact this office.     Genital Herpes Genital herpes is a common sexually transmitted infection (STI) that is caused by a virus. The virus spreads from person to person through sexual contact. Infection can cause itching, blisters, and sores around the genitals or rectum. Symptoms may last several days and then go away This is called an outbreak. However, the virus remains in your body, so you may have more outbreaks in the future. The time between outbreaks varies and can be months or years. Genital herpes affects men and women. It is particularly concerning for pregnant women because the virus can be passed to the baby during delivery and can cause serious problems. Genital herpes is also a concern for people who have a weak disease-fighting (immune) system. What are the causes? This condition is caused by the herpes simplex virus (HSV) type 1 or type 2. The virus may spread through:  Sexual contact with an infected person, including vaginal, anal, and oral sex.  Contact with fluid from a herpes sore.  The skin. This means that you can get herpes from an infected partner even if he or she does not have a visible sore or does not know that he or she is  infected.  What increases the risk? You are more likely to develop this condition if:  You have sex with many partners.  You do not use latex condoms during sex.  What are the signs or symptoms? Most people do not have symptoms (asymptomatic) or have mild symptoms that may be mistaken for other skin problems. Symptoms may include:  Small red bumps near the genitals, rectum, or mouth. These bumps turn into blisters and then turn into sores.  Flu-like symptoms, including: ? Fever. ? Body aches. ? Swollen lymph nodes. ? Headache.  Painful urination.  Pain and itching in the genital area or rectal area.  Vaginal discharge.  Tingling or shooting pain in the legs and buttocks.  Generally, symptoms are more severe and last longer during the first (primary) outbreak. Flu-like symptoms are also more common during the primary outbreak. How is this diagnosed? Genital herpes may be diagnosed based on:  A physical exam.  Your medical history.  Blood tests.  A test of a fluid sample (culture) from an open sore.  How is this treated? There is no cure for this condition, but treatment with antiviral medicines that are taken by mouth (orally) can do the following:  Speed up healing and relieve symptoms.  Help to reduce the spread of the virus to sexual partners.  Limit the chance of future outbreaks, or make future outbreaks shorter.  Lessen symptoms of future outbreaks.  Your health care provider may also recommend pain relief medicines, such as aspirin or ibuprofen. Follow  these instructions at home: Sexual activity  Do not have sexual contact during active outbreaks.  Practice safe sex. Latex condoms and female condoms may help prevent the spread of the herpes virus. General instructions  Keep the affected areas dry and clean.  Take over-the-counter and prescription medicines only as told by your health care provider.  Avoid rubbing or touching blisters and sores. If  you do touch blisters or sores: ? Wash your hands thoroughly with soap and water. ? Do not touch your eyes afterward.  To help relieve pain or itching, you may take the following actions as directed by your health care provider: ? Apply a cold, wet cloth (cold compress) to affected areas 4-6 times a day. ? Apply a substance that protects your skin and reduces bleeding (astringent). ? Apply a gel that helps relieve pain around sores (lidocaine gel). ? Take a warm, shallow bath that cleans the genital area (sitz bath).  Keep all follow-up visits as told by your health care provider. This is important. How is this prevented?  Use condoms. Although anyone can get genital herpes during sexual contact, even with the use of a condom, a condom can provide some protection.  Avoid having multiple sexual partners.  Talk with your sexual partner about any symptoms either of you may have. Also, talk with your partner about any history of STIs.  Get tested for STIs before you have sex. Ask your partner to do the same.  Do not have sexual contact if you have symptoms of genital herpes. Contact a health care provider if:  Your symptoms are not improving with medicine.  Your symptoms return.  You have new symptoms.  You have a fever.  You have abdominal pain.  You have redness, swelling, or pain in your eye.  You notice new sores on other parts of your body.  You are a woman and experience bleeding between menstrual periods.  You have had herpes and you become pregnant or plan to become pregnant. Summary  Genital herpes is a common sexually transmitted infection (STI) that is caused by the herpes simplex virus (HSV) type 1 or type 2.  These viruses are most often spread through sexual contact with an infected person.  You are more likely to develop this condition if you have sex with many partners or you have unprotected sex.  Most people do not have symptoms (asymptomatic) or have  mild symptoms that may be mistaken for other skin problems. Symptoms occur as outbreaks that may happen months or years apart.  There is no cure for this condition, but treatment with oral antiviral medicines can reduce symptoms, reduce the chance of spreading the virus to a partner, prevent future outbreaks, or shorten future outbreaks. This information is not intended to replace advice given to you by your health care provider. Make sure you discuss any questions you have with your health care provider. Document Released: 06/23/2000 Document Revised: 05/26/2016 Document Reviewed: 05/26/2016 Elsevier Interactive Patient Education  2017 [employer].

## 2020-10-20 ENCOUNTER — Encounter: Payer: Self-pay | Admitting: Family Medicine

## 2024-05-23 ENCOUNTER — Ambulatory Visit: Payer: Self-pay | Admitting: Cardiology

## 2024-06-12 NOTE — Progress Notes (Unsigned)
 Cardiology Office Note   Date:  06/13/2024   ID:  Ella, Golomb 08/15/1965, MRN [id number]  PCP:  Randol Dawes, MD  Cardiologist:   None Referring:  Randol Dawes, MD  No chief complaint on file.     History of Present Illness: Cheyenne Stanley is a 58 y.o. female who presents for evaluation of arrhythmia.  She is referred by Randol Dawes, MD .   She reports that she has had a resting heartbeat that has been elevated.  This has been for some time but she feels like it has been inhibiting her exercise.  She bought an elliptical and she says she can only be on it for about 20 minutes before she has to stop because she has decreased exercise tolerance.  She says her heart rate goes into the 120s when she is exercising.  At rest it is 80-90.  She thinks that someone hide.  She does not have presyncope.  She does not have shortness of breath.  She has no chest pressure, neck or arm discomfort.  Her heart rate does eventually come back down.  She is not describing irregular heartbeat.  She has had no prior cardiac testing.  She was a little concerned because her mom apparently had an ASD or PFO diagnosed late in life.      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 Austin)   TONSILLECTOMY  child     Current Outpatient Medications  Medication Sig Dispense Refill   Estradiol (IMVEXXY MAINTENANCE PACK) 4 MCG INST Place vaginally.     levothyroxine (SYNTHROID) 75 MCG tablet Take 75 mcg by mouth daily before breakfast.     Multiple Vitamin (MULTIVITAMIN) tablet Take 1 tablet by mouth daily.     Omega-3 Fatty Acids (FISH OIL OMEGA-3 PO) Take by mouth.     PARoxetine (PAXIL) 20 MG tablet Take 20 mg by mouth daily. (Patient taking differently:  Take 10 mg by mouth daily.)     No current facility-administered medications for this visit.    Allergies:   Patient has no known allergies.    Social History:  The patient  reports that she has never smoked. She has never used smokeless tobacco. She reports that she does not drink alcohol and does not use drugs.   Family History:  The patient's family history includes Alcohol abuse in her father; Asthma in her mother; Cancer in her maternal aunt and mother; Hypothyroidism in her maternal uncle and mother; Stroke in her father.    ROS:  Please see the history of present illness.   Otherwise, review of systems are positive for none.   All other systems are reviewed and negative.    PHYSICAL EXAM: VS:  BP 120/80 (BP Location: Left Arm, Patient Position: Sitting, Cuff Size: Normal)   Pulse 80   Ht 5' 5 (1.651 m)   Wt 140 lb (63.5 kg)   LMP 08/15/2011   SpO2 98%   BMI 23.30 kg/m  , BMI Body mass index is 23.3 kg/m. GENERAL:  Well appearing HEENT:  Pupils equal round and reactive, fundi not visualized, oral mucosa unremarkable NECK:  No jugular venous distention, waveform within normal  limits, carotid upstroke brisk and symmetric, no bruits, no thyromegaly LYMPHATICS:  No cervical, inguinal adenopathy LUNGS:  Clear to auscultation bilaterally BACK:  No CVA tenderness CHEST:  Unremarkable HEART:  PMI not displaced or sustained,S1 and S2 within normal limits, no S3, no S4, no clicks, no rubs, no murmurs ABD:  Flat, positive bowel sounds normal in frequency in pitch, no bruits, no rebound, no guarding, no midline pulsatile mass, no hepatomegaly, no splenomegaly EXT:  2 plus pulses throughout, no edema, no cyanosis no clubbing SKIN:  No rashes no nodules NEURO:  Cranial nerves II through XII grossly intact, motor grossly intact throughout [facility]:  Cognitively intact, oriented to person place and time    EKG:  EKG Interpretation Date/Time:  Friday June 13 2024 11:26:06  EST Ventricular Rate:  87 PR Interval:  140 QRS Duration:  66 QT Interval:  360 QTC Calculation: 433 R Axis:   60  Text Interpretation: Normal sinus rhythm Normal ECG No previous ECGs available Confirmed by Lavona Agent ([id number]) on 06/13/2024 11:33:57 AM     Recent Labs: No results found for requested labs within last 365 days.    Lipid Panel No results found for: CHOL, TRIG, HDL, CHOLHDL, VLDL, LDLCALC, LDLDIRECT    Wt Readings from Last 3 Encounters:  06/13/24 140 lb (63.5 kg)  04/28/17 126 lb 12.8 oz (57.5 kg)  02/25/13 127 lb 12.8 oz (58 kg)      Other studies Reviewed: Additional studies/ records that were reviewed today include: Labs. Review of the above records demonstrates:  Please see elsewhere in the note.     ASSESSMENT AND PLAN:  Tachycardia: The patient describes exercise-induced accelerated heart rates.  I am going to bring her back for a POET (Plain Old Exercise Treadmill).  If this demonstrates no automatic arrhythmia I would not suggest further testing but would rather have her do some training and see if this improves with time.  Electrolytes and thyroid are unremarkable.  Dyslipidemia her LDL was 157 with a total 233.  I would like to bring her back for a calcium score to help set goals of therapy.  We had a long discussion about this.   Current medicines are reviewed at length with the patient today.  The patient does not have concerns regarding medicines.  The following changes have been made:  no change  Labs/ tests ordered today include:   Orders Placed This Encounter  Procedures   EKG 12-Lead     Disposition:   FU with me based on the results of the above.   Signed, Agent Lavona, MD  06/13/2024 11:47 AM    [facility]

## 2024-06-13 ENCOUNTER — Encounter: Payer: Self-pay | Admitting: Cardiology

## 2024-06-13 ENCOUNTER — Ambulatory Visit: Payer: Self-pay | Attending: Cardiology | Admitting: Cardiology

## 2024-06-13 VITALS — BP 120/80 | HR 80 | Ht 65.0 in | Wt 140.0 lb

## 2024-06-13 DIAGNOSIS — R0602 Shortness of breath: Secondary | ICD-10-CM

## 2024-06-13 DIAGNOSIS — R002 Palpitations: Secondary | ICD-10-CM | POA: Diagnosis not present

## 2024-06-13 NOTE — Patient Instructions (Signed)
 Medication Instructions:  Your physician recommends that you continue on your current medications as directed. Please refer to the Current Medication list given to you today.  *If you need a refill on your cardiac medications before your next appointment, please call your pharmacy*  Lab Work: NONE If you have labs (blood work) drawn today and your tests are completely normal, you will receive your results only by: MyChart Message (if you have MyChart) OR A paper copy in the mail If you have any lab test that is abnormal or we need to change your treatment, we will call you to review the results.  Testing/Procedures: Exercise Tolerance Test **NO food or drink for 4 hours prior to test **NO caffeine, decaf or chocolate for 12 hours prior to test **Wear closed toed shoes and comfortable clothing  Coronary Calcium Score Your physician has requested that you have a coronary calcium score performed. This is not covered by insurance and will be an out-of-pocket cost of approximately $99.   Follow-Up: At [facility], you and your health needs are our priority.  As part of our continuing mission to provide you with exceptional heart care, our providers are all part of one team.  This team includes your primary Cardiologist (physician) and Advanced Practice Providers or APPs (Physician Assistants and Nurse Practitioners) who all work together to provide you with the care you need, when you need it.  Your next appointment:   As needed  Provider:   Lavona, MD  We recommend signing up for the patient portal called MyChart.  Sign up information is provided on this After Visit Summary.  MyChart is used to connect with patients for Virtual Visits (Telemedicine).  Patients are able to view lab/test results, encounter notes, upcoming appointments, etc.  Non-urgent messages can be sent to your provider as well.   To learn more about what you can do with MyChart, go to  [email or web address].

## 2024-06-19 ENCOUNTER — Inpatient Hospital Stay (HOSPITAL_COMMUNITY)
Admission: RE | Admit: 2024-06-19 | Discharge: 2024-06-19 | Payer: Self-pay | Attending: Cardiology | Admitting: Cardiology

## 2024-06-19 DIAGNOSIS — R002 Palpitations: Secondary | ICD-10-CM | POA: Insufficient documentation

## 2024-06-19 DIAGNOSIS — R0602 Shortness of breath: Secondary | ICD-10-CM | POA: Insufficient documentation

## 2024-06-20 ENCOUNTER — Ambulatory Visit: Payer: Self-pay | Admitting: Cardiology

## 2024-06-20 ENCOUNTER — Ambulatory Visit (HOSPITAL_COMMUNITY)
Admission: RE | Admit: 2024-06-20 | Discharge: 2024-06-20 | Payer: Self-pay | Attending: Cardiology | Admitting: Cardiology

## 2024-06-20 ENCOUNTER — Other Ambulatory Visit (HOSPITAL_COMMUNITY)

## 2024-06-20 ENCOUNTER — Encounter (HOSPITAL_COMMUNITY)

## 2024-06-20 DIAGNOSIS — R0602 Shortness of breath: Secondary | ICD-10-CM

## 2024-06-20 DIAGNOSIS — R002 Palpitations: Secondary | ICD-10-CM | POA: Diagnosis present

## 2024-06-20 LAB — EXERCISE TOLERANCE TEST
Angina Index: 0
Base ST Depression (mm): 0 mm
Duke Treadmill Score: 7
Estimated workload: 8.5
Exercise duration (min): 7 min
Exercise duration (sec): 0 s
MPHR: 162 {beats}/min
Peak HR: 153 {beats}/min
Percent HR: 94 %
RPE: 18
Rest HR: 83 {beats}/min
ST Depression (mm): 0 mm
